# Patient Record
Sex: Male | Born: 1985 | Race: White | Hispanic: No | Marital: Married | Smoking: Never smoker
Health system: Southern US, Community
[De-identification: ages and names within clinical notes are randomized; demographics above are authoritative.]

---

## 2018-12-04 ENCOUNTER — Ambulatory Visit (INDEPENDENT_AMBULATORY_CARE_PROVIDER_SITE_OTHER): Payer: Self-pay

## 2018-12-04 ENCOUNTER — Ambulatory Visit (INDEPENDENT_AMBULATORY_CARE_PROVIDER_SITE_OTHER): Payer: Managed Care, Other (non HMO)

## 2018-12-04 ENCOUNTER — Encounter (INDEPENDENT_AMBULATORY_CARE_PROVIDER_SITE_OTHER): Payer: Self-pay | Admitting: Orthopaedic Surgery

## 2018-12-04 ENCOUNTER — Ambulatory Visit (INDEPENDENT_AMBULATORY_CARE_PROVIDER_SITE_OTHER): Payer: Managed Care, Other (non HMO) | Admitting: Orthopaedic Surgery

## 2018-12-04 VITALS — BP 138/94 | HR 65 | Resp 13 | Ht 74.0 in | Wt 230.0 lb

## 2018-12-04 DIAGNOSIS — M25562 Pain in left knee: Secondary | ICD-10-CM

## 2018-12-04 DIAGNOSIS — G8929 Other chronic pain: Secondary | ICD-10-CM | POA: Diagnosis not present

## 2018-12-04 DIAGNOSIS — M25561 Pain in right knee: Secondary | ICD-10-CM

## 2018-12-04 NOTE — Progress Notes (Signed)
Office Visit Note   Patient: John English           Date of Birth: 1986-01-04           MRN: [id number] Visit Date: 12/04/2018              Requested by: Wallis Bamberg, PA-C [address] [place], [place] [address] PCP: Patient, No Pcp Per   Assessment & Plan: Visit Diagnoses:  1. Chronic pain of both knees     Plan: Chronic, recurrent bilateral knee pain with symptoms consistent with chondromalacia patella.  Remote history of injury with potential meniscal tearing by MRI scan performed nearly 10 years ago.  I think a course of physical therapy to address the chondromalacia patella would be helpful.  Would also suggest bilateral knee MRI scans with a prior history of injury.  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  Temporarily living in [place].  Needs a definitive diagnosis before returning  Follow-Up Instructions: Return after MRI both knees.   Orders:  Orders Placed This Encounter  Procedures  . XR KNEE 3 VIEW RIGHT  . XR KNEE 3 VIEW LEFT   No orders of the defined types were placed in this encounter.     Procedures: No procedures performed   Clinical Data: No additional findings.   Subjective: Chief Complaint  Patient presents with  . Right Knee - Pain  . Left Knee - Pain  John English presents in the office today for bilateral knee pain. Patient states he had an injury in 2012 from doing a back flip off of a roof. Patient states he was told there was a torn meniscus in the left knee but was later told there was not a tear and he would heal overtime. Patient then sought a second opinion and was then told he did have a tear and was still pretty active so there was no concern. Patient states both knees hurt, are sore and they "click and pop." Patient states his activity level has decreased due to the knee pain. Patient states he currently lives in [place] and is here visiting family. He will be headed to [place] in  March to visit family for 3 month before heading back to [place].  Approximately 10 years ago was evaluated for knee pain.  He believes it may have been the left knee.  He had an MRI scan with a questionable diagnosis of meniscal tearing.  That seem to have resolved.  More recently he has been experiencing bilateral knee pain more anteriorly.  He has been experiencing discomfort when he sits for a length of time and when he first gets up.  Occasionally  has a sensation of a "catch " bilaterally particularly with bending stooping or squatting.  Not sure that he has had any effusion.  HPI  Review of Systems  Constitutional: Negative for chills and fatigue.  HENT: Negative for sneezing.   Eyes: Negative for redness.  Respiratory: Negative for shortness of breath.   Cardiovascular: Negative for chest pain.  Gastrointestinal: Negative for abdominal pain.  Endocrine: Negative for polyuria.  Genitourinary: Negative for flank pain.  Musculoskeletal: Negative for neck pain.  Skin: Negative for wound.  Allergic/Immunologic: Negative for immunocompromised state.  Neurological: Negative for headaches.  Hematological: Does not bruise/bleed easily.  Psychiatric/Behavioral: Negative for confusion. The patient is not nervous/anxious.      Objective: Vital Signs: BP (!) 138/94 (BP Location: Right Arm, Patient Position:  Sitting, Cuff Size: Normal)   Pulse 65   Resp 13   Ht 6\' 2"  (1.88 m)   Wt 230 lb (104.3 kg)   BMI 29.53 kg/m   Physical Exam Constitutional:      Appearance: He is well-developed.  Eyes:     Pupils: Pupils are equal, round, and reactive to light.  Pulmonary:     Effort: Pulmonary effort is normal.  Skin:    General: Skin is warm and dry.  Neurological:     Mental Status: He is alert and oriented to person, place, and time.  Psychiatric:        Behavior: Behavior normal.     Ortho Exam right knee without effusion.  Some patellar crepitation but no significant pain with  compression.  No particular medial lateral joint pain or lateral patella discomfort.  Full extension of flexion over 105 degrees.  No instability.  No mass in the popliteal space.  No calf pain.  Straight leg raise negative.  No pain with range of motion of either hip.  Left knee without effusion.  No instability.  Some patellar crepitation more so than on the right.  No medial lateral joint pain.  Full extension and flexion over 105 degrees.  No popliteal mass.  No calf pain.  Neurovascular exam intact distally and bilaterally.  Painless range of motion left hip Specialty Comments:  No specialty comments available.  Imaging: Xr Knee 3 View Left  Result Date: 12/04/2018 Films of the left knee were obtained in 3 projections standing.  Normal alignment.  No ectopic calcification.  No acute changes.  There is a lateral patella tilt with hypertrophy of the lateral patella facet to a greater extent than identified on the right.  Symptoms are consistent with chondromalacia patella  Xr Knee 3 View Right  Result Date: 12/04/2018 Films of the right knee were obtained in 3 projections standing.  Normal alignment.  No ectopic calcification.  No acute changes.  Joint spaces are well-maintained.  There is slight lateral patella tilt with some hypertrophy about the lateral patella facet.  Patient is experiencing chondromalacia patella symptoms    PMFS History: There are no active problems to display for this patient.  History reviewed. No pertinent past medical history.  History reviewed. No pertinent family history.  History reviewed. No pertinent surgical history. Social History   Occupational History  . Not on file  Tobacco Use  . Smoking status: Never Smoker  . Smokeless tobacco: Never Used  Substance and Sexual Activity  . Alcohol use: Not Currently  . Drug use: Never  . Sexual activity: Not on file

## 2018-12-05 ENCOUNTER — Other Ambulatory Visit: Payer: Self-pay | Admitting: *Deleted

## 2018-12-05 DIAGNOSIS — G8929 Other chronic pain: Secondary | ICD-10-CM

## 2018-12-05 DIAGNOSIS — M25561 Pain in right knee: Secondary | ICD-10-CM

## 2018-12-05 DIAGNOSIS — M25562 Pain in left knee: Principal | ICD-10-CM

## 2018-12-06 ENCOUNTER — Telehealth (INDEPENDENT_AMBULATORY_CARE_PROVIDER_SITE_OTHER): Payer: Self-pay | Admitting: Orthopaedic Surgery

## 2018-12-06 NOTE — Telephone Encounter (Signed)
Patient's wife would like for patient to go to [place] if possible. Carollee HerterShannon calling to let you know she can [place] [facility] [place], if that is okay with you. Or, do you have a facility in [place] you would rather refer the patient to? Please call Carollee HerterShannon back to inform.  [phone]

## 2018-12-06 NOTE — Telephone Encounter (Signed)
Order changed to [facility]

## 2018-12-09 ENCOUNTER — Ambulatory Visit (HOSPITAL_BASED_OUTPATIENT_CLINIC_OR_DEPARTMENT_OTHER)
Admission: RE | Admit: 2018-12-09 | Discharge: 2018-12-09 | Disposition: A | Discharge status: Home / Self Care | Payer: Managed Care, Other (non HMO) | Source: Ambulatory Visit | Attending: Orthopaedic Surgery | Admitting: Orthopaedic Surgery

## 2018-12-09 DIAGNOSIS — G8929 Other chronic pain: Secondary | ICD-10-CM

## 2018-12-09 DIAGNOSIS — M25561 Pain in right knee: Secondary | ICD-10-CM | POA: Insufficient documentation

## 2018-12-09 DIAGNOSIS — M25562 Pain in left knee: Secondary | ICD-10-CM | POA: Diagnosis present

## 2018-12-13 ENCOUNTER — Other Ambulatory Visit: Payer: Self-pay

## 2018-12-13 ENCOUNTER — Ambulatory Visit: Payer: Managed Care, Other (non HMO) | Attending: Orthopaedic Surgery | Admitting: Physical Therapy

## 2018-12-13 DIAGNOSIS — R262 Difficulty in walking, not elsewhere classified: Secondary | ICD-10-CM | POA: Diagnosis present

## 2018-12-13 DIAGNOSIS — M25561 Pain in right knee: Secondary | ICD-10-CM | POA: Insufficient documentation

## 2018-12-13 DIAGNOSIS — M6281 Muscle weakness (generalized): Secondary | ICD-10-CM | POA: Insufficient documentation

## 2018-12-13 DIAGNOSIS — G8929 Other chronic pain: Secondary | ICD-10-CM | POA: Insufficient documentation

## 2018-12-13 DIAGNOSIS — M25562 Pain in left knee: Secondary | ICD-10-CM | POA: Diagnosis not present

## 2018-12-13 NOTE — Therapy (Signed)
[facility] [address]  [address] [place], [place], [address] Phone: [phone]   Fax:  [phone]  Physical Therapy Evaluation  Patient Details  Name: John English MRN: [id number] Date of Birth: 1986-05-17 Referring Provider (PT): Norlene Campbell, MD   Encounter Date: 12/13/2018  PT End of Session - 12/13/18 0758    Visit Number  1    Number of Visits  8    Date for PT Re-Evaluation  01/10/19    Authorization Type  [employer] (visits per medical necessity)    PT Start Time  0758    PT Stop Time  0844    PT Time Calculation (min)  46 min    Activity Tolerance  Patient tolerated treatment well    Behavior During Therapy  [facility] for tasks assessed/performed       No past medical history on file.  No past surgical history on file.  There were no vitals filed for this visit.   Subjective Assessment - 12/13/18 0800    Subjective  Pt reports after college (2012) he did a back flip off a roof and felt a pop in his knees. Initially L knee was most uncomfortable - MRI revealed torn meniscus. Currently able to get through normal day w/o limitations but reports awareness of discomfort at most times. Unable to run the way he would like or playing with kids.    Currently in Pain?  No/denies    Pain Score  0-No pain   up to 4-6/10   Pain Location  Knee    Pain Orientation  Right;Left    Pain Descriptors / Indicators  --   "clicking & grinding"   Pain Type  Chronic pain    Pain Radiating Towards  n/a    Pain Onset  More than a month ago   2012   Pain Frequency  Intermittent    Aggravating Factors   running, pivoting with extended knee    Pain Relieving Factors  rest    Effect of Pain on Daily Activities  able to complete daily activities but unable to run or play sports         [facility] PT Assessment - 12/13/18 0758      Assessment   Medical Diagnosis  Chronic B knee pain    Referring Provider (PT)  Norlene Campbell, MD     Onset Date/Surgical Date  --   2012   Next MD Visit  12/28/18    Prior Therapy  none      Balance Screen   Has the patient fallen in the past 6 months  No    Has the patient had a decrease in activity level because of a fear of falling?   No    Is the patient reluctant to leave their home because of a fear of falling?   No      Home Public house manager residence      Prior Function   Level of Independence  Independent    Vocation  --   traveling   Leisure  would like to be able to run or play sports      Observation/Other Assessments   Focus on Therapeutic Outcomes (FOTO)   Knee - 67% (33% limitation); Predicted 78% (22% limitation)      Posture/Postural Control   Posture/Postural Control  Postural limitations    Posture Comments  B genu recurvatum in standing  ROM / Strength   AROM / PROM / Strength  AROM;Strength      AROM   AROM Assessment Site  Knee    Right/Left Knee  Right;Left    Right Knee Extension  -8   hyperextension   Right Knee Flexion  140    Left Knee Extension  -10   hyperextension   Left Knee Flexion  142      Strength   Strength Assessment Site  Hip;Knee    Right/Left Hip  Right;Left    Right Hip Flexion  4+/5    Right Hip Extension  4+/5    Right Hip External Rotation   4/5    Right Hip Internal Rotation  4/5    Right Hip ABduction  4+/5    Right Hip ADduction  4/5    Left Hip Flexion  4+/5    Left Hip Extension  4+/5    Left Hip External Rotation  4/5    Left Hip Internal Rotation  4/5    Left Hip ABduction  4/5    Left Hip ADduction  4+/5    Right/Left Knee  Right;Left    Right Knee Flexion  4+/5    Right Knee Extension  5/5    Left Knee Flexion  4+/5    Left Knee Extension  5/5      Flexibility   Soft Tissue Assessment /Muscle Length  yes    Hamstrings  mod tight B    Quadriceps  quads QFL, but mod tight B hip flexors    ITB  mild tight B    Piriformis  very mild tight B      Palpation   Patella  mobility  WNL                Objective measurements completed on examination: See above findings.      [facility] Adult PT Treatment/Exercise - 12/13/18 0758      Exercises   Exercises  Knee/Hip      Knee/Hip Exercises: Stretches   Passive Hamstring Stretch  Right;Left;30 seconds;1 rep    Passive Hamstring Stretch Limitations  supine with strap - cues to avoid knee hyperextension    Hip Flexor Stretch  Right;Left;30 seconds;1 rep    Hip Flexor Stretch Limitations  mod thomas with strap, prone with strap & hip extended, 1/2 kneel lunge with strap for knee flexion - pt reporting best stretch with mod thomas position    ITB Stretch  Right;Left;30 seconds;1 rep    ITB Stretch Limitations  supine with strap      Knee/Hip Exercises: Standing   Wall Squat  10 reps;5 seconds    Wall Squat Limitations  + hip adduction ball squeeze      Knee/Hip Exercises: Supine   Straight Leg Raises  Right;Left;15 reps    Straight Leg Raises Limitations  cues for quad activation with neutral knee avoiding hyperextension - visible shaking present in VMO             PT Education - 12/13/18 0842    Education Details  PT eval findings, anticipated POC, instruction to avoid knee hyperextension in standing & initial HEP    Person(s) Educated  Patient    Methods  Explanation;Demonstration;Handout    Comprehension  Verbalized understanding;Returned demonstration;Need further instruction          PT Long Term Goals - 12/13/18 0844      PT LONG TERM GOAL #1   Title  Independent with ongoing HEP  Status  New    Target Date  01/10/19      PT LONG TERM GOAL #2   Title  B hip and knee strength 5/5 with good VMO activation    Status  New    Target Date  01/10/19      PT LONG TERM GOAL #3   Title  Patient will report ability to complete full squat w/o limitation due to knee pain or "catching"    Status  New    Target Date  01/10/19      PT LONG TERM GOAL #4   Title  Patient will  demostrate B neutral knee alignment in standing w/o hyperextension    Status  New    Target Date  01/10/19      PT LONG TERM GOAL #5   Title  Patient will report abillity to jog/run up to 1 mile w/o limitation due to B knee pain    Status  New    Target Date  01/10/19             Plan - 12/13/18 0844    Clinical Impression Statement  John English is a [age] y/o male who presents to OP PT for chronic B knee pain intermittent since 2012. Patient reports initially injuring his knee during a bad landing while doing a back flip off of a roof in 2012. Pain at the time was worst in L knee and MRI at the time revealed torn meniscus, but was told he would not require surgery and he was not limited at the time so did not do PT. Patient reporting decreasing activity level of late due to B knee pain - had started trying to run for exercise a few weeks back but was unable to due to B knee pain and also notes limitations participating in sports such as soccer or frisbee golf.  He reports "popping and clicking" in B knees with pivoting on (hyper)extended knees and occasionally  has a sensation of a "catch " bilaterally particularly with bending, stooping or squatting The report for current imaging via MRI reveals B chondromalacia with small knee effusions, thin medial plica in R knee, and a possible small grade 3 tear with parameniscal cyst in the anterior horn of the lateral meniscus. Assessment reveals B knee AROM WNL with 8-10 dg of genu recurvatum bilaterally, moderate proximal LE tightness greatest in hip flexors and hamstrings, B patellar mobility WNL w/o pain, and mild proximal B LE weakness with decreased VMO activation. John English will benefit from skilled PT to address above deficits to allow return to running and sporting activities w/o limitation due to knee pain.  Patient states he currently lives in [place] and is here visiting family. [place] will be headed to [place] in March to visit family for 3 months before heading  back to [place].     Clinical Presentation  Stable    Clinical Decision Making  Low    Rehab Potential  Good    Clinical Impairments Affecting Rehab Potential  chroniity of problem    PT Frequency  2x / week    PT Duration  4 weeks    PT Treatment/Interventions  Patient/family education;Neuromuscular re-education;Therapeutic exercise;Therapeutic activities;Functional mobility training;Gait training;Stair training;Balance training;Manual techniques;Taping;Dry needling;Electrical Stimulation;Cryotherapy;Vasopneumatic Device;Iontophoresis 4mg /ml Dexamethasone;ADLs/Self Care Home Management    PT Next Visit Plan  Review initial HEP; quad strengthening avoiding hyperextension; proximal LE strengthening    Consulted and Agree with Plan of Care  Patient       Patient will  benefit from skilled therapeutic intervention in order to improve the following deficits and impairments:  Pain, Impaired flexibility, Decreased strength, Decreased activity tolerance, Difficulty walking, Impaired perceived functional ability  Visit Diagnosis: Chronic pain of left knee  Chronic pain of right knee  Muscle weakness (generalized)  Difficulty in walking, not elsewhere classified     Problem List There are no active problems to display for this patient.   Marry GuanJoAnne M Chanta Bauers, PT, MPT 12/13/2018, 9:28 AM  [facility] [address]  [address] [place], [place], [address] Phone: [phone]   Fax:  [phone]  Name: John English MRN: [id number] Date of Birth: 09/22/1986

## 2018-12-18 ENCOUNTER — Ambulatory Visit: Payer: Managed Care, Other (non HMO)

## 2018-12-18 DIAGNOSIS — M25562 Pain in left knee: Secondary | ICD-10-CM | POA: Diagnosis not present

## 2018-12-18 DIAGNOSIS — R262 Difficulty in walking, not elsewhere classified: Secondary | ICD-10-CM

## 2018-12-18 DIAGNOSIS — M25561 Pain in right knee: Secondary | ICD-10-CM

## 2018-12-18 DIAGNOSIS — M6281 Muscle weakness (generalized): Secondary | ICD-10-CM

## 2018-12-18 DIAGNOSIS — G8929 Other chronic pain: Secondary | ICD-10-CM

## 2018-12-18 NOTE — Therapy (Addendum)
[facility] [address]  [place], [place], [address] Phone: [phone]   Fax:  [phone]  Physical Therapy Treatment / Discharge Summary  Patient Details  Name: John English MRN: [id number] Date of Birth: [birth year] Referring Provider (PT): Joni Fears, MD   Encounter Date: 12/18/2018  PT End of Session - 12/18/18 0809    Visit Number  2    Number of Visits  8    Date for PT Re-Evaluation  01/10/19    Authorization Type  [employer] (visits per medical necessity)    PT Start Time  0800    PT Stop Time  0845    PT Time Calculation (min)  45 min    Activity Tolerance  Patient tolerated treatment well    Behavior During Therapy  [facility] for tasks assessed/performed       History reviewed. No pertinent past medical history.  History reviewed. No pertinent surgical history.  There were no vitals filed for this visit.  Subjective Assessment - 12/18/18 0806    Subjective  Doing well and reports no issues with HEP.      Currently in Pain?  No/denies    Pain Score  0-No pain   pain rising to 6/10    Pain Location  Knee    Pain Orientation  Right;Left    Pain Descriptors / Indicators  --   "clicking" "grinding"   Pain Type  Chronic pain                       [facility] Adult PT Treatment/Exercise - 12/18/18 0828      Knee/Hip Exercises: Stretches   Passive Hamstring Stretch  Right;Left;30 seconds;1 rep    Passive Hamstring Stretch Limitations  supine with strap - cues to avoid knee hyperextension    Hip Flexor Stretch  Right;Left;30 seconds;1 rep    Hip Flexor Stretch Limitations  mod thomas with strap, prone with strap & hip extended, 1/2 kneel lunge with strap for knee flexion - pt reporting best stretch with mod thomas position    ITB Stretch  Right;Left;30 seconds;1 rep    ITB Stretch Limitations  supine with strap      Knee/Hip Exercises: Aerobic   Nustep  Lvl 4, 6 min       Knee/Hip  Exercises: Standing   Hip Flexion  Right;Left;10 reps;Knee straight    Hip Flexion Limitations  red TB at ankle    Hip ADduction  Right;Left;10 reps;Strengthening    Hip ADduction Limitations  red TB at ankle    Hip Abduction  Right;Left;10 reps;Knee straight;Stengthening    Abduction Limitations  red TB at ankle    Hip Extension  Right;Left;10 reps;Stengthening    Extension Limitations  red TB at ankle    Step Down  Left;Right;15 reps;Hand Hold: 2;Step Height: 4"   cues to avoid B knee hyperextension    Step Down Limitations  light UE support on machine     Wall Squat  10 reps;5 seconds   Cues provided for proper squat depth   Wall Squat Limitations  + hip adduction ball squeeze    Other Standing Knee Exercises  B side stepping, forward backwards monster walk with red TB at ankles 2 x 25 ft              PT Education - 12/18/18 0944    Education Details  HEP update; 4-way SLR with red TB issued to pt.  Person(s) Educated  Patient    Methods  Explanation;Demonstration;Verbal cues;Handout    Comprehension  Verbalized understanding;Returned demonstration;Verbal cues required;Need further instruction          PT Long Term Goals - 12/18/18 0810      PT LONG TERM GOAL #1   Title  Independent with ongoing HEP    Status  On-going      PT LONG TERM GOAL #2   Title  B hip and knee strength 5/5 with good VMO activation    Status  On-going      PT LONG TERM GOAL #3   Title  Patient will report ability to complete full squat w/o limitation due to knee pain or "catching"    Status  On-going      PT LONG TERM GOAL #4   Title  Patient will demostrate B neutral knee alignment in standing w/o hyperextension    Status  On-going      PT LONG TERM GOAL #5   Title  Patient will report abillity to jog/run up to 1 mile w/o limitation due to B knee pain    Status  On-going            Plan - 12/18/18 7341    Clinical Impression Statement  Pt. reporting no issues with  initial HEP.  Required minor cueing with review of wall-sit to prevent excessive depth to ensure control of movement.  Initiated standing 4-way SLR with red TB resistance which was tolerated well without increased knee pain.  HEP updated with standing 4-way SLR to focus on proximal hip strengthening with red band issued to pt.  Will monitor tolerance to updated HEP and continue to progress toward goals in upcoming visit.      Rehab Potential  Good    Clinical Impairments Affecting Rehab Potential  chroniity of problem    PT Treatment/Interventions  Patient/family education;Neuromuscular re-education;Therapeutic exercise;Therapeutic activities;Functional mobility training;Gait training;Stair training;Balance training;Manual techniques;Taping;Dry needling;Electrical Stimulation;Cryotherapy;Vasopneumatic Device;Iontophoresis 8m/ml Dexamethasone;ADLs/Self Care Home Management    PT Next Visit Plan  English tolerance for updated HEP; quad strengthening avoiding hyperextension; proximal LE strengthening    Consulted and Agree with Plan of Care  Patient       Patient will benefit from skilled therapeutic intervention in order to improve the following deficits and impairments:  Pain, Impaired flexibility, Decreased strength, Decreased activity tolerance, Difficulty walking, Impaired perceived functional ability  Visit Diagnosis: Chronic pain of left knee  Chronic pain of right knee  Muscle weakness (generalized)  Difficulty in walking, not elsewhere classified     Problem List There are no active problems to display for this patient.   MBess Harvest PTA 12/18/18 9:50 AM   [facility] [place] [address]Phone: [phone]  Fax:  [phone] Name: John HopwoodMRN: [id number] of Birth: [birth year] PHYSICAL THERAPY DISCHARGE SUMMARY  Visits from Start of Care: 2  Current functional level  related to goals / functional outcomes:   Unable to assess as patient only returned for 1 PT treatment visit before patient contacted PT stating he was told by MD to hold PT until MRI review with MD, [place]   Remaining deficits:   As above.   Education / Equipment:   HEP  Plan: Patient agrees to discharge.  Patient goals were not met.  Patient is being discharged due to not returning since the last visit.  ?????     Percival Spanish, PT, MPT 01/15/19, 2:28 PM  [facility] [address]  [place], [place], [address] Phone: [phone]   Fax:  [phone]

## 2018-12-19 ENCOUNTER — Telehealth (INDEPENDENT_AMBULATORY_CARE_PROVIDER_SITE_OTHER): Payer: Self-pay | Admitting: Orthopaedic Surgery

## 2018-12-19 NOTE — Telephone Encounter (Signed)
Patient called stating at his last physical therapy appointment he was told to contact Dr. Cleophas Dunker to see if there should be any changes to his sessions based on the MRI report.  Patient is scheduled to see Dr. Cleophas Dunker on 12/28/18.

## 2018-12-20 NOTE — Telephone Encounter (Signed)
Please advise 

## 2018-12-20 NOTE — Telephone Encounter (Signed)
Spoke with patient and relayed the information below

## 2018-12-20 NOTE — Telephone Encounter (Signed)
Hold therapy until after visit

## 2018-12-21 ENCOUNTER — Ambulatory Visit: Payer: Managed Care, Other (non HMO)

## 2018-12-25 ENCOUNTER — Encounter: Payer: Managed Care, Other (non HMO) | Admitting: Physical Therapy

## 2018-12-28 ENCOUNTER — Ambulatory Visit (INDEPENDENT_AMBULATORY_CARE_PROVIDER_SITE_OTHER): Payer: Managed Care, Other (non HMO) | Admitting: Orthopaedic Surgery

## 2018-12-28 ENCOUNTER — Encounter (INDEPENDENT_AMBULATORY_CARE_PROVIDER_SITE_OTHER): Payer: Self-pay | Admitting: Orthopaedic Surgery

## 2018-12-28 VITALS — BP 119/71 | HR 64 | Ht 74.0 in | Wt 225.0 lb

## 2018-12-28 DIAGNOSIS — M25562 Pain in left knee: Secondary | ICD-10-CM | POA: Diagnosis not present

## 2018-12-28 DIAGNOSIS — G8929 Other chronic pain: Secondary | ICD-10-CM

## 2018-12-28 DIAGNOSIS — M25561 Pain in right knee: Secondary | ICD-10-CM | POA: Diagnosis not present

## 2018-12-28 NOTE — Progress Notes (Signed)
Office Visit Note   Patient: John English           Date of Birth: 08-Jan-1986           MRN: [id number] Visit Date: 12/28/2018              Requested by: No referring provider defined for this encounter. PCP: Patient, No Pcp Per   Assessment & Plan: Visit Diagnoses:  1. Chronic pain of both knees     Plan: MRI scan was performed of both knees.  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  Left knee arthroscopy demonstrated similar findings of the patella and also a possible tear at the root of the lateral meniscus with about a 1 x 0.5 cm cyst.  Mr. Franchetti does not appear to be symptomatic in that area.  I reviewed the MRI scan findings with him in detail. he has a copy of the scans for both knees.  We have talked about continuing his exercises for the chondromalacia patella as he is been through a course of physical therapy.  I do not think that the lateral meniscus is symptomatic at this point would not suggest any specific treatment.  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  We talked about anti-inflammatory medicines and the exercises and possibly an occasional cortisone injection  Follow-Up Instructions: Return if symptoms worsen or fail to improve.   Orders:  No orders of the defined types were placed in this encounter.  No orders of the defined types were placed in this encounter.     Procedures: No procedures performed   Clinical Data: No additional findings.   Subjective: Chief Complaint  Patient presents with  . Left Knee - Follow-up  . Right Knee - Follow-up  Patient presents today for follow up on his knees. He had an MRI on both on 12/09/2018. No change in his knees. He does not take anything for pain.   HPI  Review of Systems   Objective: Vital Signs: BP 119/71   Pulse 64   Ht 6\' 2"   (1.88 m)   Wt 225 lb (102.1 kg)   BMI 28.89 kg/m   Physical Exam Constitutional:      Appearance: He is well-developed.  Eyes:     Pupils: Pupils are equal, round, and reactive to light.  Pulmonary:     Effort: Pulmonary effort is normal.  Skin:    General: Skin is warm and dry.  Neurological:     Mental Status: He is alert and oriented to person, place, and time.  Psychiatric:        Behavior: Behavior normal.     Ortho Exam positive patellar crepitation both knees.  No effusion.  No medial lateral joint pain either knee.  No instability.  Negative anterior drawer sign.  No calf pain.  No popliteal fullness.  Neurologically intact.  Walks without a limp.  No pain along the medial lateral patella facets  Specialty Comments:  No specialty comments available.  Imaging: No results found.   PMFS History: There are no active problems to display for this patient.  History reviewed. No pertinent past medical history.  History reviewed. No pertinent family history.  History reviewed. No pertinent surgical history. Social History   Occupational History  . Not on file  Tobacco Use  .  Smoking status: Never Smoker  . Smokeless tobacco: Never Used  Substance and Sexual Activity  . Alcohol use: Not Currently  . Drug use: Never  . Sexual activity: Not on file

## 2019-01-01 ENCOUNTER — Ambulatory Visit: Payer: Managed Care, Other (non HMO) | Admitting: Physical Therapy

## 2019-01-11 ENCOUNTER — Encounter: Payer: Managed Care, Other (non HMO) | Admitting: Physical Therapy

## 2019-11-09 IMAGING — MR MR KNEE*R* W/O CM
7 series · 40 of 40 positions shown · non-contrast
Comparison: Radiographs from 12/04/2018

CLINICAL DATA: Bilateral knee pain

EXAM:
MRI OF THE RIGHT KNEE WITHOUT CONTRAST
TECHNIQUE: Multiplanar, multisequence MR imaging of the knee was performed. No
intravenous contrast was administered.

[Series 3: T2 fat-sat · axial · 4.0mm · 0.62mm/px · z∈[-62,+68]mm · 7 of 27 slices shown (1 of 3)]
[im 1/27]
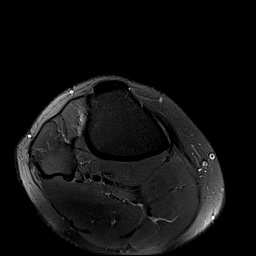
[im 5/27]
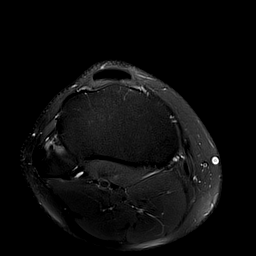
[im 9/27]
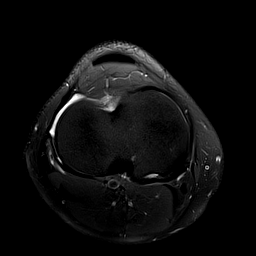
[im 14/27]
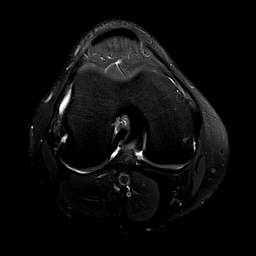
[im 18/27]
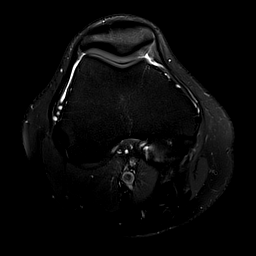
[im 22/27]
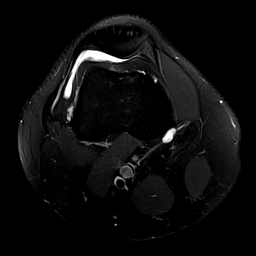
[im 27/27]
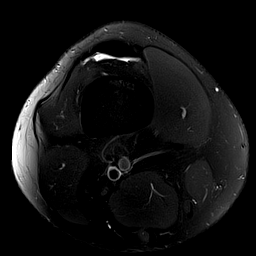

[Series 4: T1 · coronal · 4.0mm · 0.47mm/px · 6 of 25 slices shown]
[im 1/25]
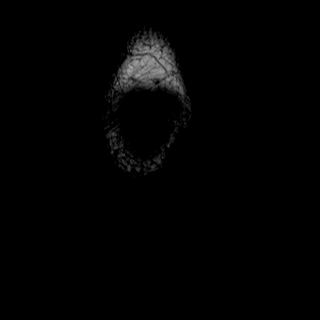
[im 5/25]
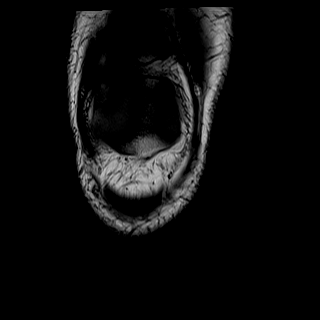
[im 10/25]
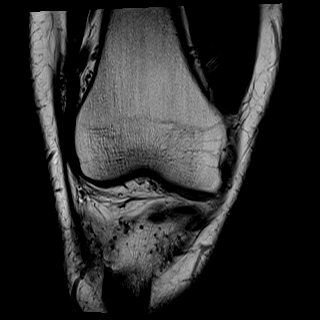
[im 15/25]
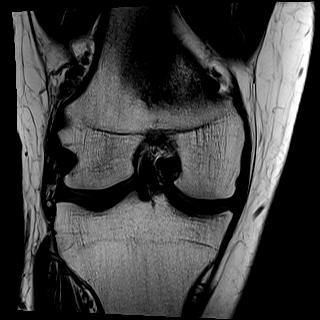
[im 20/25]
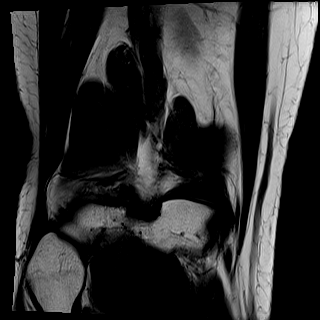
[im 25/25]
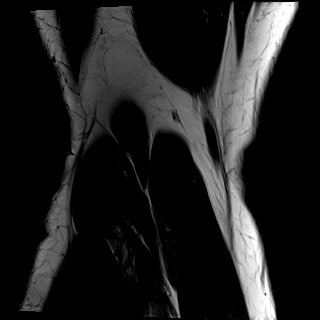

[Series 5: T2 fat-sat · coronal · 4.0mm · 0.59mm/px · 5 of 25 slices shown (2 of 3)]
[im 1/25]
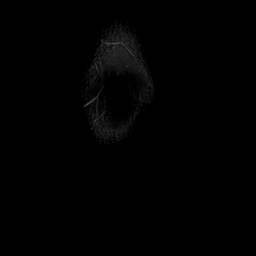
[im 7/25]
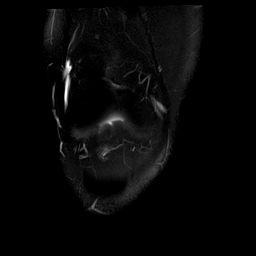
[im 13/25]
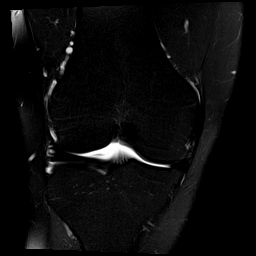
[im 19/25]
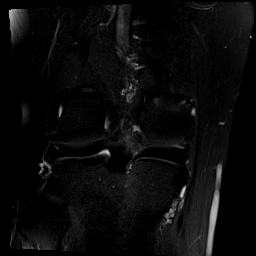
[im 25/25]
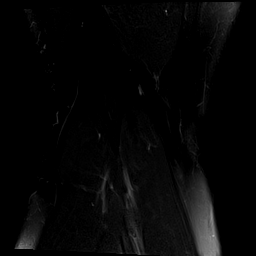

[Series 6: PD fat-sat · coronal · 3.0mm · 0.59mm/px · 6 of 30 slices shown (1 of 2)]
[im 1/30]
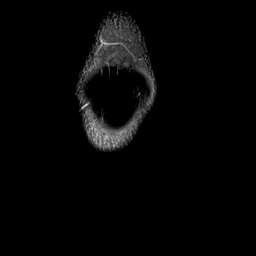
[im 6/30]
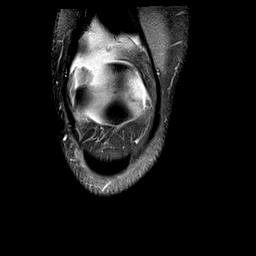
[im 12/30]
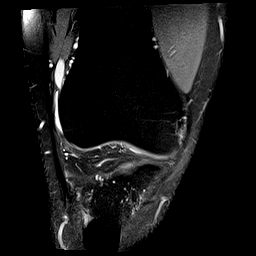
[im 18/30]
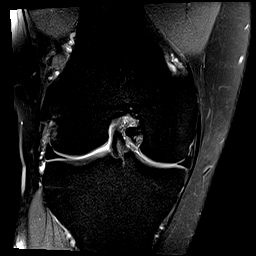
[im 24/30]
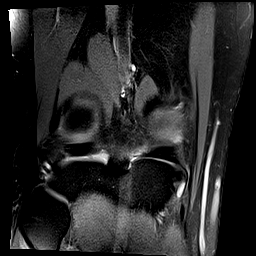
[im 30/30]
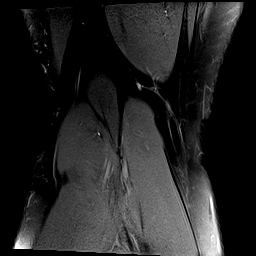

[Series 7: PD fat-sat · sagittal · 3.0mm · 0.59mm/px · 6 of 30 slices shown (2 of 2)]
[im 1/30]
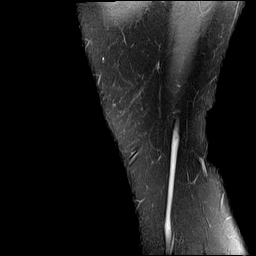
[im 6/30]
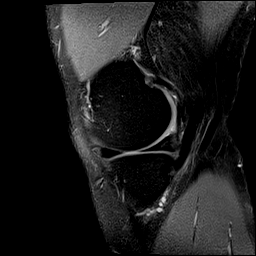
[im 12/30]
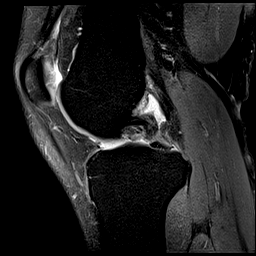
[im 18/30]
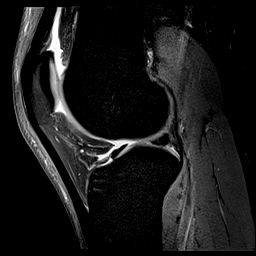
[im 24/30]
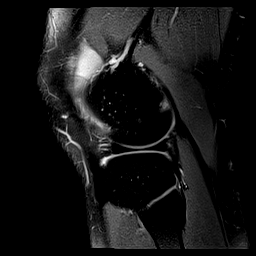
[im 30/30]
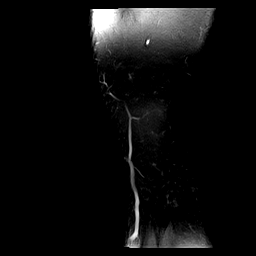

[Series 8: T2 fat-sat · sagittal · 3.0mm · 0.59mm/px · 6 of 30 slices shown (3 of 3)]
[im 1/30]
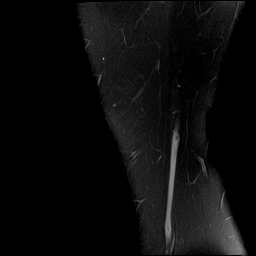
[im 6/30]
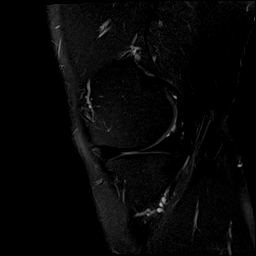
[im 12/30]
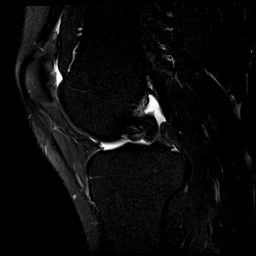
[im 18/30]
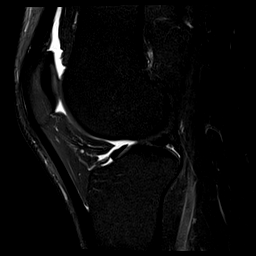
[im 24/30]
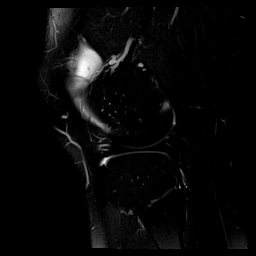
[im 30/30]
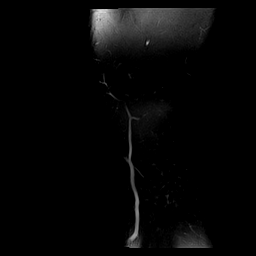

[Series 9: PD · coronal · 2.0mm · 0.47mm/px · 4 of 18 slices shown]
[im 1/18]
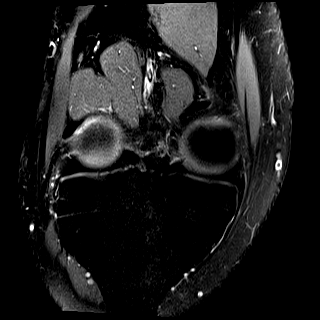
[im 6/18]
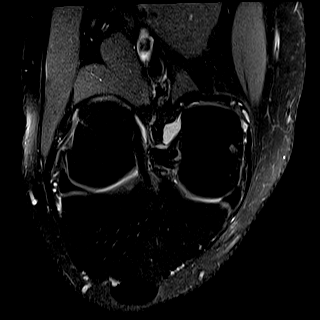
[im 12/18]
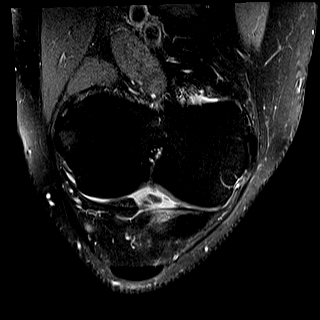
[im 18/18]
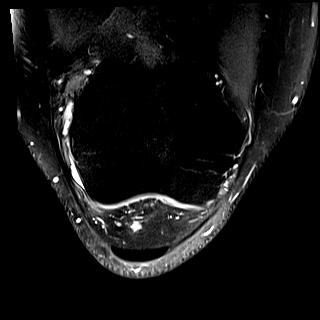

[40 of 40 positions shown; findings below may reference images not displayed]

FINDINGS: MENISCI

Medial meniscus:  Unremarkable

Lateral meniscus:  Unremarkable

LIGAMENTS

Cruciates:  Unremarkable

Collaterals:  Unremarkable

CARTILAGE

Patellofemoral: Mild patella fissuring with adjacent chondral he
Leunard/grade 2 chondromalacia noted along the medial patellar facet on
images 8 through 10 of series 3. Mild chondral heterogeneity along
the medial patellar facet. Minimal linear accentuated signal
laterally in the articular cartilage along the lateral facet on
image [DATE]. Femoral trochlear groove articular cartilage
unremarkable.

Medial:  Unremarkable

Lateral:  Unremarkable

Joint:  Small knee effusion.  Thin medial plica.

Popliteal Fossa: Mild semimembranosus-tibial collateral ligament
bursitis.

Extensor Mechanism:  Unremarkable

Bones: No significant extra-articular osseous abnormalities
identified.

Other: No supplemental non-categorized findings.
IMPRESSION: 1. Grade 2 chondromalacia along the lateral patellar facet with some
mild associated chondral irregularity. There is potentially minimal
chondral fissuring laterally along the lateral patellar facet.
2. Mild semimembranosus-tibial collateral ligament bursitis.
3. Small knee effusion with thin medial plica.
